# Patient Record
Sex: Male | Born: 1990 | Race: White | Hispanic: No | Marital: Single | Smoking: Never smoker
Health system: Southern US, Community
[De-identification: ages and names within clinical notes are randomized; demographics above are authoritative.]

## PROBLEM LIST (undated history)

## (undated) ENCOUNTER — Ambulatory Visit (HOSPITAL_COMMUNITY): Admission: EM | Discharge status: Absent without leave | Payer: Self-pay

## (undated) DIAGNOSIS — T7840XA Allergy, unspecified, initial encounter: Secondary | ICD-10-CM

## (undated) DIAGNOSIS — J45909 Unspecified asthma, uncomplicated: Secondary | ICD-10-CM

## (undated) HISTORY — DX: Unspecified asthma, uncomplicated: J45.909

## (undated) HISTORY — DX: Allergy, unspecified, initial encounter: T78.40XA

---

## 2011-04-09 ENCOUNTER — Ambulatory Visit: Payer: BC Managed Care – PPO

## 2011-04-09 ENCOUNTER — Ambulatory Visit (INDEPENDENT_AMBULATORY_CARE_PROVIDER_SITE_OTHER): Payer: BC Managed Care – PPO | Admitting: Family Medicine

## 2011-04-09 DIAGNOSIS — M549 Dorsalgia, unspecified: Secondary | ICD-10-CM

## 2011-04-09 DIAGNOSIS — J45990 Exercise induced bronchospasm: Secondary | ICD-10-CM | POA: Insufficient documentation

## 2011-04-09 MED ORDER — CYCLOBENZAPRINE HCL 10 MG PO TABS: 10.0000 mg | ORAL_TABLET | Freq: Two times a day (BID) | ORAL | Status: AC | PRN

## 2011-04-09 MED ORDER — ALBUTEROL SULFATE HFA 108 (90 BASE) MCG/ACT IN AERS: 2.0000 | INHALATION_SPRAY | Freq: Four times a day (QID) | RESPIRATORY_TRACT | Status: DC | PRN

## 2011-04-09 NOTE — Progress Notes (Signed)
  Patient Name: Glenn May Date of Birth: 1990/10/25 Medical Record Number: [id number] Gender: male Date of Encounter: 04/09/2011  History of Present Illness:  Glenn May is a 21 y.o. very pleasant male patient who presents with the following:  Today is Tuesday.  On Saturday he was playing basketball- his friend hit him with an elbow in his back/ "spine"  In the lower thoracic area when he was guarding him.  Did not hurt too much at the time.  Yesterday went to his job at [employer]- had to lift boxes for several hours- his back hurt when he got home and especially when he laid down.  Did not get much sleep last night.  Back is still sore- no history of prior back problems  Also needs a refill of his albuterol for occasional EIA symptoms.   Patient Active Problem List  Diagnoses  . Exercise-induced asthma   History reviewed. No pertinent past medical history. History reviewed. No pertinent past surgical history. History  Substance Use Topics  . Smoking status: Never Smoker   . Smokeless tobacco: Never Used  . Alcohol Use: No   History reviewed. No pertinent family history. No Known Allergies  Medication list has been reviewed and updated.  Review of Systems: As per HPI.  No numbness or tingling, no incontinence.   Physical Examination: Filed Vitals:   04/09/11 1829  BP: 116/65  Pulse: 57  Temp: 97.9 F (36.6 C)  TempSrc: Oral  Resp: 16  Height: 6' 0.5" (1.842 m)  Weight: 206 lb 12.8 oz (93.804 kg)    Body mass index is 27.66 kg/(m^2).  GEN: WDWN, NAD, Non-toxic, A & O x 3 HEENT: Atraumatic, Normocephalic. Neck supple. No masses, No LAD. Ears and Nose: No external deformity. CV: RRR, No M/G/R. No JVD. No thrill. No extra heart sounds. PULM: CTA B, no wheezes, crackles, rhonchi. No retractions. No resp. distress. No accessory muscle use. Back:  Tender over lower thoracic spine.  Small bruise.  Tight Paraspinous muscles as well. Bilateral legs with normal  strength, sensation and ROM, as well as normal patellar reflexes.   EXTR: No c/c/e NEURO Normal gait.  PSYCH: Normally interactive. Conversant. Not depressed or anxious appearing.  Calm demeanor.  [facility] reading (PRIMARY) by  Dr. Patsy Lager.  Negative 2v thoracic and lumbar spine films  Assessment and Plan: 1. Exercise-induced asthma  albuterol (PROVENTIL HFA;VENTOLIN HFA) 108 (90 BASE) MCG/ACT inhaler  2. Back pain  cyclobenzaprine (FLEXERIL) 10 MG tablet, DG Lumbar Spine 2-3 Views, DG Thoracic Spine 2 View    Prn albuterol as above. Back strain/ contusion.  Xrays reassuring.  Will plan to be out of work the rest of the week, let us know if not better within 4 or 5 days- Sooner if worse.

## 2012-01-21 ENCOUNTER — Ambulatory Visit (INDEPENDENT_AMBULATORY_CARE_PROVIDER_SITE_OTHER): Payer: BC Managed Care – PPO | Admitting: Family Medicine

## 2012-01-21 VITALS — BP 124/72 | HR 86 | Temp 98.2°F | Resp 18 | Ht 72.0 in | Wt 189.0 lb

## 2012-01-21 DIAGNOSIS — J329 Chronic sinusitis, unspecified: Secondary | ICD-10-CM

## 2012-01-21 DIAGNOSIS — J40 Bronchitis, not specified as acute or chronic: Secondary | ICD-10-CM

## 2012-01-21 DIAGNOSIS — J209 Acute bronchitis, unspecified: Secondary | ICD-10-CM

## 2012-01-21 MED ORDER — AZITHROMYCIN 250 MG PO TABS: ORAL_TABLET | ORAL | Status: DC

## 2012-01-21 NOTE — Progress Notes (Signed)
21 yo with 5-6 days of cough, weakness after birthday trip to [place].  Initially  Had low grade fever.  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  Objective:  NAD @[facility] @  Chief Complaint:  Chief Complaint  Patient presents with  . Cough    x 3 days  . Fatigue    HPI: 21 y.o. year old male presents with a 6 day history of nasal congestion, post nasal drip, sore throat, and cough. Mild sinus pressure. Afebrile. No chills. Nasal congestion thick and green/yellow. Cough is productive of green/yellow sputum and not associated with time of day. Ears feel full, leading to sensation of muffled hearing. Has tried OTC cold preps without success. No GI complaints.  No sick contacts, recent antibiotics, or recent travels.   No leg trauma, sedentary periods, h/o cancer, or tobacco use.  Past Medical History  Diagnosis Date  . Allergy   . Asthma      Home Meds: Prior to Admission medications   Medication Sig Start Date End Date Taking? Authorizing Provider  albuterol (PROVENTIL HFA;VENTOLIN HFA) 108 (90 BASE) MCG/ACT inhaler Inhale 2 puffs into the lungs every 6 (six) hours as needed for wheezing. 04/09/11 04/08/12  Pearline Cables, MD    Allergies: No Known Allergies  History   Social History  . Marital Status: Single    Spouse Name: N/A    Number of Children: N/A  . Years of Education: N/A   Occupational History  . Not on file.   Social History Main Topics  . Smoking status: Never Smoker   . Smokeless tobacco: Never Used  . Alcohol Use: Yes  . Drug Use: No  . Sexually Active: Yes    Birth Control/ Protection: Condom   Other Topics Concern  . Not on file   Social History Narrative  . No narrative on file     Review of Systems: Constitutional: negative for chills, fever, night sweats or weight changes Cardiovascular: negative for chest pain or palpitations Respiratory: negative for hemoptysis, wheezing, or shortness of breath Abdominal:  negative for abdominal pain, nausea, vomiting or diarrhea Dermatological: negative for rash Neurologic: negative for headache   Physical Exam: Blood pressure 124/72, pulse 86, temperature 98.2 F (36.8 C), temperature source Oral, resp. rate 18, height 6' (1.829 m), weight 189 lb (85.73 kg), SpO2 100.00%., Body mass index is 25.63 kg/(m^2). General: Well developed, well nourished, in no acute distress. Head: Normocephalic, atraumatic, eyes without discharge, sclera non-icteric, nares are congested. Bilateral auditory canals clear, TM's are without perforation, pearly grey with reflective cone of light bilaterally. No sinus TTP. Oral cavity moist, dentition normal. Posterior pharynx with post nasal drip and mild erythema. No peritonsillar abscess or tonsillar exudate. Neck: Supple. No thyromegaly. Full ROM. No lymphadenopathy. Lungs: Coarse breath sounds bilaterally with  rhonchi. Breathing is unlabored.  Heart: RRR with S1 S2. No murmurs, rubs, or gallops appreciated. Msk:  Strength and tone normal for age. Extremities: No clubbing or cyanosis. No edema. Neuro: Alert and oriented X 3. Moves all extremities spontaneously. CNII-XII grossly in tact. Psych:  Responds to questions appropriately with a normal affect.   Labs:   ASSESSMENT AND PLAN:  21 y.o. year old male with bronchitis and sinusitis - -Mucinex -Tylenol/Motrin prn -Rest/fluids -RTC precautions -RTC 3-5 days if no improvement  Signed, Elvina Sidle, MD 01/21/2012 3:45 PM

## 2012-01-21 NOTE — Patient Instructions (Addendum)

## 2012-05-25 ENCOUNTER — Ambulatory Visit (INDEPENDENT_AMBULATORY_CARE_PROVIDER_SITE_OTHER): Payer: BC Managed Care – PPO | Admitting: Physician Assistant

## 2012-05-25 VITALS — BP 118/68 | HR 58 | Temp 98.2°F | Resp 16 | Ht 72.0 in | Wt 193.0 lb

## 2012-05-25 DIAGNOSIS — M549 Dorsalgia, unspecified: Secondary | ICD-10-CM

## 2012-05-25 DIAGNOSIS — M62838 Other muscle spasm: Secondary | ICD-10-CM

## 2012-05-25 MED ORDER — METAXALONE 800 MG PO TABS: 800.0000 mg | ORAL_TABLET | Freq: Three times a day (TID) | ORAL | Status: DC

## 2012-05-25 MED ORDER — HYDROCODONE-ACETAMINOPHEN 5-325 MG PO TABS: 1.0000 | ORAL_TABLET | Freq: Four times a day (QID) | ORAL | Status: DC | PRN

## 2012-05-25 NOTE — Progress Notes (Signed)
Patient ID: DUNBAR BURAS MRN: [id number], DOB: 1990/09/04, 21 y.o. Date of Encounter: 05/25/2012, 6:47 PM  Primary Physician: No primary provider on file.  Chief Complaint: Back pain x 1 day  HPI: 22 y.o. male with history below presents with back pain for one day. Patient was playing basketball at the time of the injury. Patient states he was going up for a rebound and trying to tip up the ball. Upon doing so he felt like he may have hyperextended his lower back. He continued to play in his basketball game. His back was a little sore at that time, but continued to play. Later that evening he noticed that his left lower back began to tighten up prompting him to come in for evaluation this evening. He knew he would be unable to perform his usual job tasks at [employer] this evening of lifting heavy boxes. He denies any midline tenderness, pain radiating down into his leg, weakness into his leg, or loss of bowel or bladder function. Generally healthy.    Past Medical History  Diagnosis Date  . Allergy   . Asthma      Home Meds: Prior to Admission medications   Medication Sig Start Date End Date Taking? Authorizing Provider                  Allergies: No Known Allergies  History   Social History  . Marital Status: Single    Spouse Name: N/A    Number of Children: N/A  . Years of Education: N/A   Occupational History  . Not on file.   Social History Main Topics  . Smoking status: Never Smoker   . Smokeless tobacco: Never Used  . Alcohol Use: Yes  . Drug Use: No  . Sexually Active: Yes    Birth Control/ Protection: Condom   Other Topics Concern  . Not on file   Social History Narrative  . No narrative on file     Review of Systems: Constitutional: negative for chills, fever, night sweats, weight changes, or fatigue  Cardiovascular: negative for chest pain or palpitations Respiratory: negative for hemoptysis, wheezing, shortness of breath, or cough Dermatological:  negative for rash Neurologic: negative for headache, dizziness, or syncope   Physical Exam: Blood pressure 118/68, pulse 58, temperature 98.2 F (36.8 C), resp. rate 16, height 6' (1.829 m), weight 193 lb (87.544 kg)., Body mass index is 26.17 kg/(m^2). General: Well developed, well nourished, in no acute distress. Head: Normocephalic, atraumatic, eyes without discharge, sclera non-icteric, nares are without discharge.   Neck: Supple. Full ROM.  Lungs: Breathing is unlabored. Heart: Regular rate. Msk:  Strength and tone normal for age. Back: No midline or bony TTP. FROM. 5/5 strength. TTP of left lumbar paraspinal muscles. No CVA TTP. Negative SLR seated and supine bilateral.  Extremities/Skin: Warm and dry. No clubbing or cyanosis. No edema. No rashes or suspicious lesions. Neuro: Alert and oriented X 3. Moves all extremities spontaneously. Gait is normal. CNII-XII grossly in tact. DTR 2+ bilaterally in lower extremities. Equal sensation bilateral lower extremities.  Psych:  Responds to questions appropriately with a normal affect.     ASSESSMENT AND PLAN:  22 y.o. male with muscle strain/spasm and back pain -Skelaxin 800 mg 1 po tid #40 no RF -Norco 5/325 mg 1 po q 4-6 hours prn pain #30 no RF, SED -Heat -Rest -Out of work today and tomorrow -No worrisome symptoms or findings or exam   Signed, Eula Listen, PA-C 05/25/2012  6:47 PM

## 2012-12-02 ENCOUNTER — Ambulatory Visit (INDEPENDENT_AMBULATORY_CARE_PROVIDER_SITE_OTHER): Payer: BC Managed Care – PPO | Admitting: Physician Assistant

## 2012-12-02 VITALS — BP 116/68 | HR 72 | Temp 97.6°F | Resp 14 | Ht 71.0 in | Wt 193.0 lb

## 2012-12-02 DIAGNOSIS — J029 Acute pharyngitis, unspecified: Secondary | ICD-10-CM

## 2012-12-02 DIAGNOSIS — J309 Allergic rhinitis, unspecified: Secondary | ICD-10-CM

## 2012-12-02 MED ORDER — IPRATROPIUM BROMIDE 0.03 % NA SOLN: 2.0000 | Freq: Two times a day (BID) | NASAL | Status: DC

## 2012-12-02 MED ORDER — FIRST-DUKES MOUTHWASH MT SUSP: 5.0000 mL | OROMUCOSAL | Status: DC | PRN

## 2012-12-02 NOTE — Progress Notes (Signed)
  Subjective:    Patient ID: Glenn May, male    DOB: 04-09-1990, 22 y.o.   MRN: [id number]  HPI 22 year old male presents with 4 day history of scratchy, irritated throat, slight nasal congestion, and sinus pressure. Symptoms have been present for 3 days but do seem to be improving. Does admit to some fatigue and subjective chills but no documented fever. Denies nausea, vomiting, headache, otalgia, cough, SOB, or wheezing. Does have a hx of seasonal allergies treated with OTC Claritin as needed.  Patient is otherwise healthy with no other concerns today.     Review of Systems  Constitutional: Negative for fever and chills.  HENT: Positive for congestion, postnasal drip, rhinorrhea, sinus pressure and sore throat.   Respiratory: Negative for cough, chest tightness, shortness of breath and wheezing.   Gastrointestinal: Negative for nausea, vomiting and abdominal pain.  Neurological: Negative for dizziness and headaches.       Objective:   Physical Exam  Constitutional: He is oriented to person, place, and time. He appears well-developed and well-nourished.  HENT:  Head: Normocephalic and atraumatic.  Right Ear: Hearing, tympanic membrane, external ear and ear canal normal.  Left Ear: Hearing, tympanic membrane, external ear and ear canal normal.  Mouth/Throat: Uvula is midline, oropharynx is clear and moist and mucous membranes are normal. No oropharyngeal exudate.  Eyes: Conjunctivae are normal.  Neck: Normal range of motion. Neck supple.  Cardiovascular: Normal rate, regular rhythm and normal heart sounds.   Pulmonary/Chest: Effort normal and breath sounds normal.  Lymphadenopathy:    He has no cervical adenopathy.  Neurological: He is alert and oriented to person, place, and time.  Psychiatric: He has a normal mood and affect. His behavior is normal. Judgment and thought content normal.          Assessment & Plan:  Allergic rhinitis - Plan: ipratropium (ATROVENT) 0.03 %  nasal spray  Acute pharyngitis - Plan: Diphenhyd-Hydrocort-Nystatin (FIRST-DUKES MOUTHWASH) SUSP  Trial of Atrovent NS to help with PND.  Start Claritin daily in the morning Duke's mouthwash q2-3hours prn pain. Ok to continue throat lozenges RTC precautions discussed including development of fever, productive cough, or difficulty swallowing.

## 2013-05-04 IMAGING — CR DG THORACIC SPINE 2V
2 series · 2 of 2 positions shown · non-contrast
Comparison: Lumbar series from the same day reported separately.

CLINICAL DATA: 20-year-old male with back pain.

THORACIC SPINE - 2 VIEW

[AP]
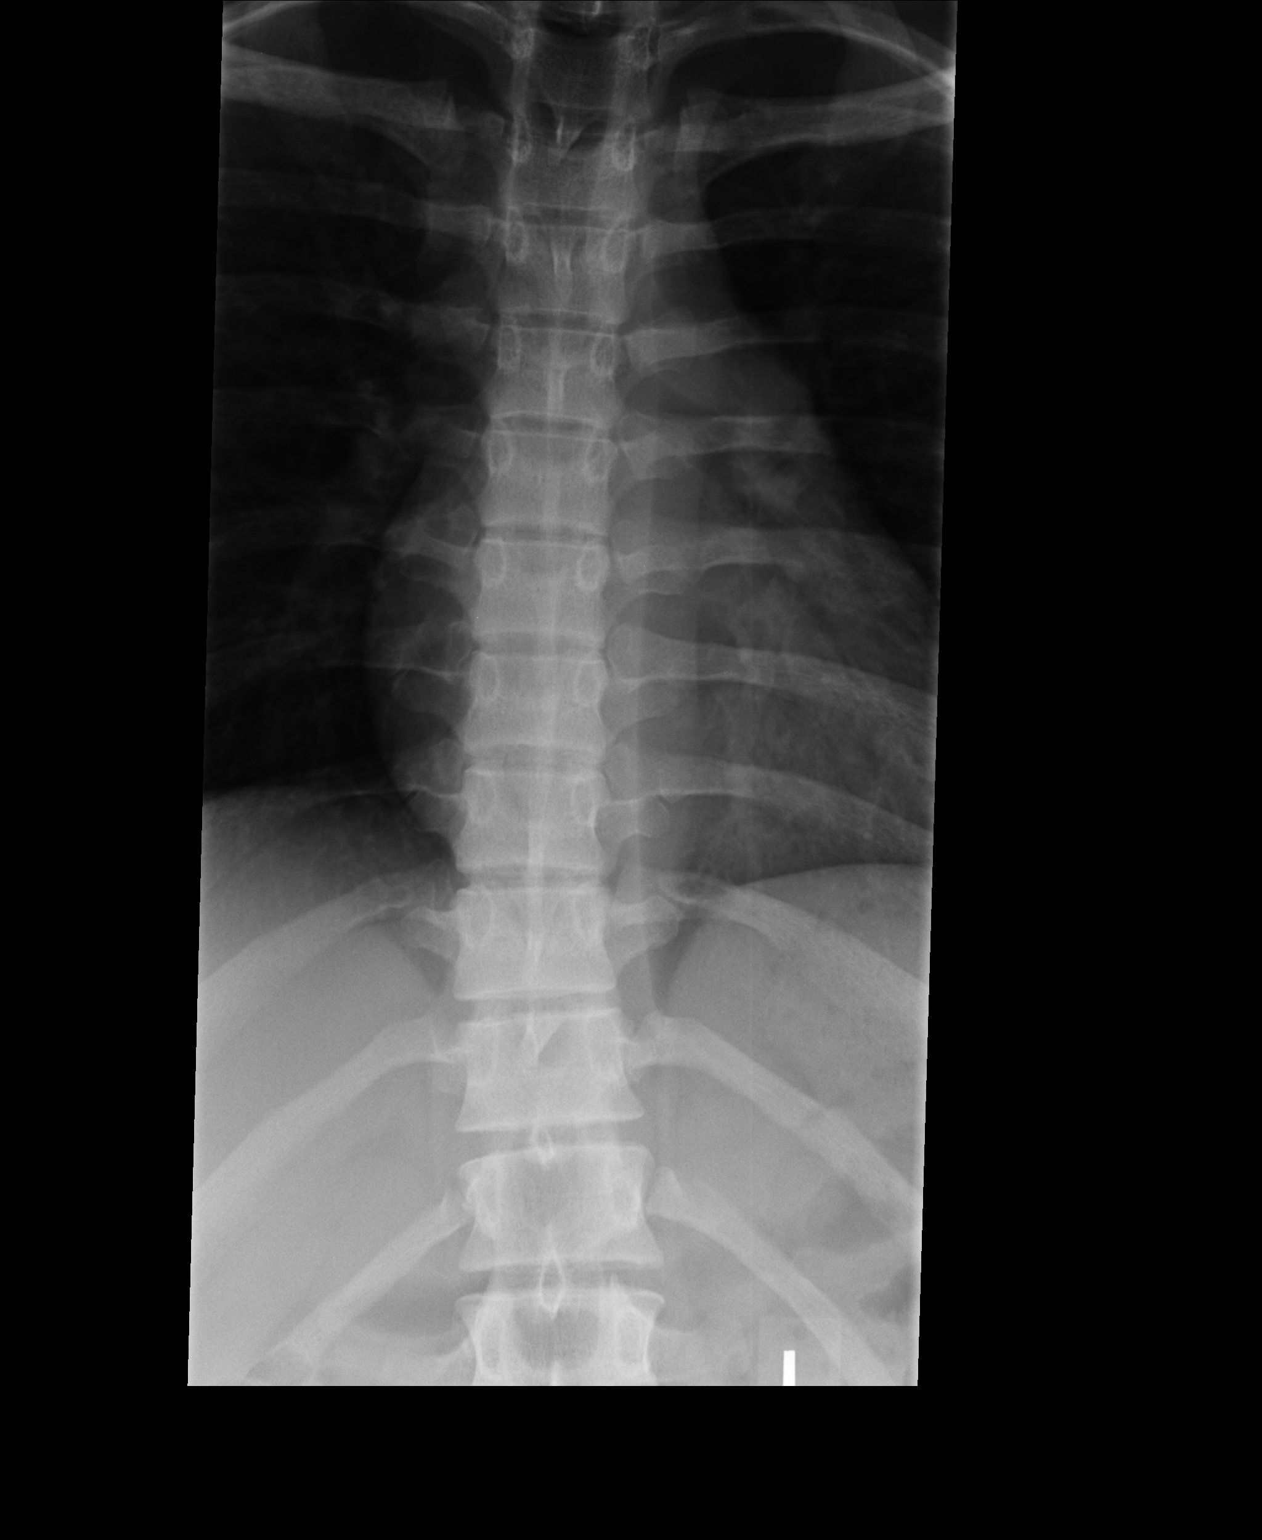

[lateral]
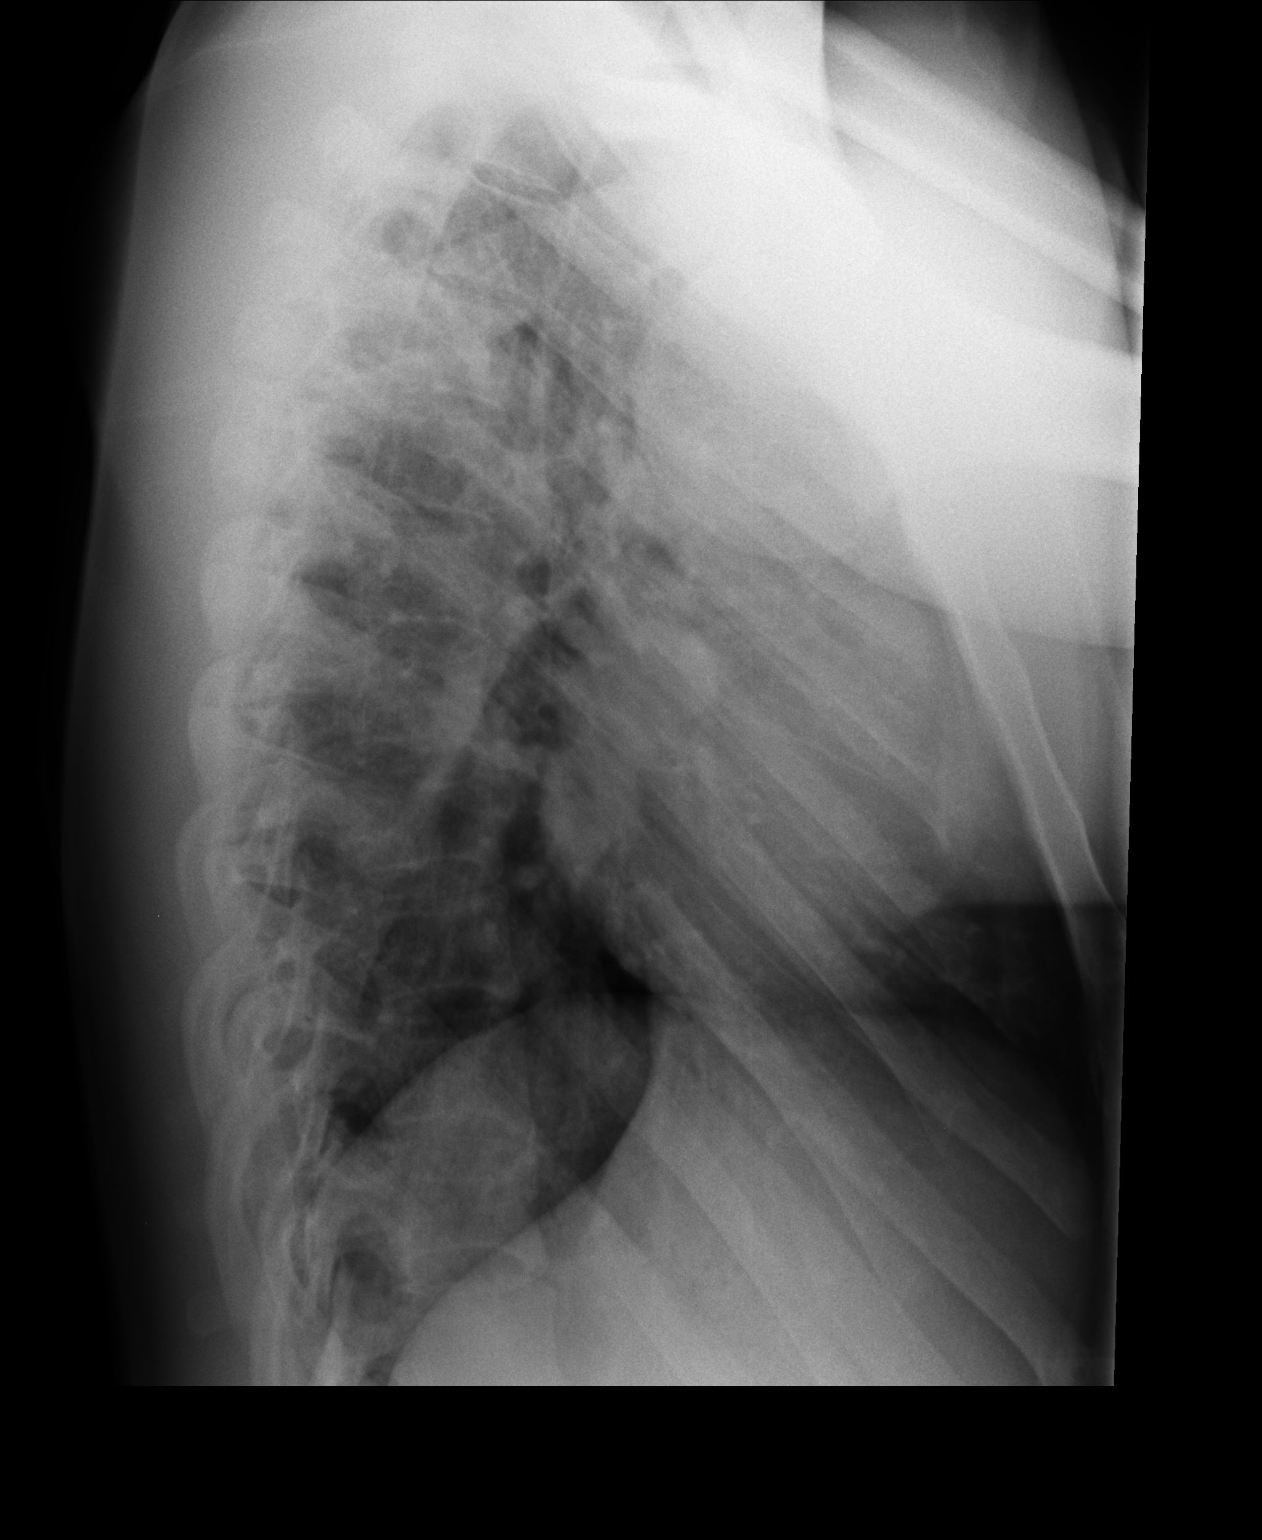

[2 of 2 positions shown; findings below may reference images not displayed]

FINDINGS: The T1 vertebral body is not included in its entirety on
the AP view.  Normal thoracic segmentation. Bone mineralization is
within normal limits.  Normal height and alignment aside from mild
dextroconvex scoliosis.  Preserved disc spaces appear grossly
normal visualized thoracic visceral contours.
IMPRESSION: No acute osseous abnormality in the thoracic spine.

Discrepancy from primary reading by Dr. Battissa:  None significant.

## 2013-07-29 ENCOUNTER — Ambulatory Visit (INDEPENDENT_AMBULATORY_CARE_PROVIDER_SITE_OTHER): Payer: BC Managed Care – PPO | Admitting: Emergency Medicine

## 2013-07-29 VITALS — BP 109/79 | HR 63 | Temp 97.8°F | Resp 18 | Wt 205.0 lb

## 2013-07-29 DIAGNOSIS — S335XXA Sprain of ligaments of lumbar spine, initial encounter: Secondary | ICD-10-CM

## 2013-07-29 MED ORDER — NAPROXEN SODIUM 550 MG PO TABS: 550.0000 mg | ORAL_TABLET | Freq: Two times a day (BID) | ORAL | Status: AC

## 2013-07-29 MED ORDER — CYCLOBENZAPRINE HCL 10 MG PO TABS: 10.0000 mg | ORAL_TABLET | Freq: Three times a day (TID) | ORAL | Status: DC | PRN

## 2013-07-29 NOTE — Progress Notes (Signed)
[facility] [address], [place] [address] [phone]  Date:  07/29/2013   Name:  Glenn May   DOB:  Mar 28, 1990   MRN:  [id number]  PCP:  No PCP Per Patient    Chief Complaint: Back Pain   History of Present Illness:  Glenn May is a 23 y.o. very pleasant male patient who presents with the following:  Works for [employer] and developed low back on the right last week.  Non radiating. No neuro symptoms.  No discrete injury or overuse.  Pain persists this week.  No pain with bending.  Worse when twists.  No increase in pain when sneezes or coughs.  No GU symptoms.  No improvement with over the counter medications or other home remedies. Denies other complaint or health concern today.   Patient Active Problem List   Diagnosis Date Noted  . Exercise-induced asthma 04/09/2011    Past Medical History  Diagnosis Date  . Allergy   . Asthma     No past surgical history on file.  History  Substance Use Topics  . Smoking status: Never Smoker   . Smokeless tobacco: Never Used  . Alcohol Use: Yes    No family history on file.  No Known Allergies  Medication list has been reviewed and updated.  Current Outpatient Prescriptions on File Prior to Visit  Medication Sig Dispense Refill  . ipratropium (ATROVENT) 0.03 % nasal spray Place 2 sprays into the nose 2 (two) times daily.  30 mL  0   No current facility-administered medications on file prior to visit.    Review of Systems:  As per HPI, otherwise negative.    Physical Examination: Filed Vitals:   07/29/13 1600  BP: 109/79  Pulse: 63  Temp: 97.8 F (36.6 C)  Resp: 18   Filed Vitals:   07/29/13 1600  Weight: 205 lb (92.987 kg)   Body mass index is 28.6 kg/(m^2). Ideal Body Weight:     GEN: WDWN, NAD, Non-toxic, Alert & Oriented x 3 HEENT: Atraumatic, Normocephalic.  Ears and Nose: No external deformity. EXTR: No clubbing/cyanosis/edema NEURO: Normal gait.  PSYCH: Normally  interactive. Conversant. Not depressed or anxious appearing.  Calm demeanor.  BACK:  Tender right lumbar paraspinous muscles.  Neuro intact  Assessment and Plan: Lumbosacral strain Anaprox flexeril  Signed,  Phillips Odor, MD

## 2013-07-29 NOTE — Patient Instructions (Signed)
Lumbosacral Strain Lumbosacral strain is a strain of any of the parts that make up your lumbosacral vertebrae. Your lumbosacral vertebrae are the bones that make up the lower third of your backbone. Your lumbosacral vertebrae are held together by muscles and tough, fibrous tissue (ligaments).  CAUSES  A sudden blow to your back can cause lumbosacral strain. Also, anything that causes an excessive stretch of the muscles in the low back can cause this strain. This is typically seen when people exert themselves strenuously, fall, lift heavy objects, bend, or crouch repeatedly. RISK FACTORS  Physically demanding work.  Participation in pushing or pulling sports or sports that require sudden twist of the back (tennis, golf, baseball).  Weight lifting.  Excessive lower back curvature.  Forward-tilted pelvis.  Weak back or abdominal muscles or both.  Tight hamstrings. SIGNS AND SYMPTOMS  Lumbosacral strain may cause pain in the area of your injury or pain that moves (radiates) down your leg.  DIAGNOSIS Your health care provider can often diagnose lumbosacral strain through a physical exam. In some cases, you may need tests such as X-ray exams.  TREATMENT  Treatment for your lower back injury depends on many factors that your clinician will have to evaluate. However, most treatment will include the use of anti-inflammatory medicines. HOME CARE INSTRUCTIONS   Avoid hard physical activities (tennis, racquetball, waterskiing) if you are not in proper physical condition for it. This may aggravate or create problems.  If you have a back problem, avoid sports requiring sudden body movements. Swimming and walking are generally safer activities.  Maintain good posture.  Maintain a healthy weight.  For acute conditions, you may put ice on the injured area.  Put ice in a plastic bag.  Place a towel between your skin and the bag.  Leave the ice on for 20 minutes, 2 3 times a day.  When the  low back starts healing, stretching and strengthening exercises may be recommended. SEEK MEDICAL CARE IF:  Your back pain is getting worse.  You experience severe back pain not relieved with medicines. SEEK IMMEDIATE MEDICAL CARE IF:   You have numbness, tingling, weakness, or problems with the use of your arms or legs.  There is a change in bowel or bladder control.  You have increasing pain in any area of the body, including your belly (abdomen).  You notice shortness of breath, dizziness, or feel faint.  You feel sick to your stomach (nauseous), are throwing up (vomiting), or become sweaty.  You notice discoloration of your toes or legs, or your feet get very cold. MAKE SURE YOU:   Understand these instructions.  Will watch your condition.  Will get help right away if you are not doing well or get worse. Document Released: 11/14/2004 Document Revised: 11/25/2012 Document Reviewed: 09/23/2012 ExitCare Patient Information 2014 [employer].  

## 2014-11-22 ENCOUNTER — Ambulatory Visit (INDEPENDENT_AMBULATORY_CARE_PROVIDER_SITE_OTHER): Payer: BLUE CROSS/BLUE SHIELD | Admitting: Family Medicine

## 2014-11-22 VITALS — BP 122/78 | HR 74 | Temp 98.3°F | Resp 16 | Ht 73.0 in | Wt 201.4 lb

## 2014-11-22 DIAGNOSIS — Z13220 Encounter for screening for lipoid disorders: Secondary | ICD-10-CM

## 2014-11-22 DIAGNOSIS — J4520 Mild intermittent asthma, uncomplicated: Secondary | ICD-10-CM

## 2014-11-22 DIAGNOSIS — Z113 Encounter for screening for infections with a predominantly sexual mode of transmission: Secondary | ICD-10-CM | POA: Diagnosis not present

## 2014-11-22 DIAGNOSIS — Z23 Encounter for immunization: Secondary | ICD-10-CM | POA: Diagnosis not present

## 2014-11-22 DIAGNOSIS — Z0000 Encounter for general adult medical examination without abnormal findings: Secondary | ICD-10-CM

## 2014-11-22 DIAGNOSIS — Z130 Encounter for screening for diseases of the blood and blood-forming organs and certain disorders involving the immune mechanism: Secondary | ICD-10-CM | POA: Diagnosis not present

## 2014-11-22 LAB — CBC
HCT: 45.9 % (ref 39.0–52.0)
Hemoglobin: 15.1 g/dL (ref 13.0–17.0)
MCH: 28.7 pg (ref 26.0–34.0)
MCHC: 32.9 g/dL (ref 30.0–36.0)
MCV: 87.3 fL (ref 78.0–100.0)
MPV: 10.3 fL (ref 8.6–12.4)
Platelets: 217 10*3/uL (ref 150–400)
RBC: 5.26 MIL/uL (ref 4.22–5.81)
RDW: 12.7 % (ref 11.5–15.5)
WBC: 4.9 10*3/uL (ref 4.0–10.5)

## 2014-11-22 LAB — COMPLETE METABOLIC PANEL WITH GFR
CO2: 27 mmol/L (ref 20–31)
Calcium: 9.7 mg/dL (ref 8.6–10.3)
Chloride: 101 mmol/L (ref 98–110)
Creat: 1.14 mg/dL (ref 0.60–1.35)
GFR, Est Non African American: >89 mL/min (ref >=60)
Glucose, Bld: 80 mg/dL (ref 65–99)
Potassium: 3.8 mmol/L (ref 3.5–5.3)
Sodium: 135 mmol/L (ref 135–146)
Total Bilirubin: 1.1 mg/dL (ref 0.2–1.2)
Total Protein: 7.2 g/dL (ref 6.1–8.1)

## 2014-11-22 LAB — LIPID PANEL
Cholesterol: 158 mg/dL (ref 125–200)
HDL: 54 mg/dL (ref >=40)
LDL Cholesterol: 94 mg/dL (ref <130)
Total CHOL/HDL Ratio: 2.9 Ratio (ref <=5.0)
Triglycerides: 52 mg/dL (ref <150)
VLDL: 10 mg/dL (ref <30)

## 2014-11-22 LAB — COMPLETE METABOLIC PANEL WITHOUT GFR
ALT: 20 U/L (ref 9–46)
AST: 23 U/L (ref 10–40)
Albumin: 4.7 g/dL (ref 3.6–5.1)
Alkaline Phosphatase: 79 U/L (ref 40–115)
BUN: 13 mg/dL (ref 7–25)
GFR, Est African American: >89 mL/min (ref >=60)

## 2014-11-22 MED ORDER — ALBUTEROL SULFATE HFA 108 (90 BASE) MCG/ACT IN AERS: 2.0000 | INHALATION_SPRAY | Freq: Four times a day (QID) | RESPIRATORY_TRACT | Status: AC | PRN

## 2014-11-22 NOTE — Progress Notes (Signed)
      Chief Complaint:  Chief Complaint  Patient presents with  . Annual Exam  . Flu Vaccine    HPI: Glenn May is a 24 y.o. male who reports to [facility] today complaining of  Annual PE, no complaints He works out 4 times a [employer], he bench presses, treadmill and he works at [employer] active, wears condoms regular, no STD in the past He ahs chest tightness when he exerts, he would like an inhaler He has allergies and take claritin  He does not remember last asthma attacks UTD on tetanus vaccines   Past Medical History  Diagnosis Date  . Allergy   . Asthma    History reviewed. No pertinent past surgical history. Social History   Social History  . Marital Status: Single    Spouse Name: N/A  . Number of Children: N/A  . Years of Education: N/A   Social History Main Topics  . Smoking status: Never Smoker   . Smokeless tobacco: Never Used  . Alcohol Use: Yes  . Drug Use: No  . Sexual Activity: Yes    Birth Control/ Protection: Condom   Other Topics Concern  . None   Social History Narrative   History reviewed. No pertinent family history. No Known Allergies Prior to Admission medications   Medication Sig Start Date End Date Taking? Authorizing Provider  loratadine (CLARITIN) 10 MG tablet Take 10 mg by mouth daily.   Yes Historical Provider, MD  Multiple Vitamin (MULTIVITAMIN) tablet Take 1 tablet by mouth daily.   Yes Historical Provider, MD     ROS: The patient denies fevers, chills, night sweats, unintentional weight loss, chest pain, palpitations, wheezing, dyspnea on exertion, nausea, vomiting, abdominal pain, dysuria, hematuria, melena, numbness, weakness, or tingling.  All other systems have been reviewed and were otherwise negative with the exception of those mentioned in the HPI and as above.    PHYSICAL EXAM: Filed Vitals:   11/22/14 1212  BP: 122/78  Pulse: 74  Temp: 98.3 F (36.8 C)  Resp: 16   Body mass index is 26.58  kg/(m^2).   General: Alert, no acute distress HEENT:  Normocephalic, atraumatic, oropharynx patent. EOMI, PERRLA Cardiovascular:  Regular rate and rhythm, no rubs murmurs or gallops.  No Carotid bruits, radial pulse intact. No pedal edema.  Respiratory: Clear to auscultation bilaterally.  No wheezes, rales, or rhonchi.  No cyanosis, no use of accessory musculature Abdominal: No organomegaly, abdomen is soft and non-tender, positive bowel sounds. No masses. Skin: No rashes. Neurologic: Facial musculature symmetric. Psychiatric: Patient acts appropriately throughout our interaction. Lymphatic: No cervical or submandibular lymphadenopathy Musculoskeletal: Gait intact. No edema, tenderness UTD on tetanus    LABS: No results found for this or any previous visit.   EKG/XRAY:   Primary read interpreted by Dr. Conley Rolls at [facility].   ASSESSMENT/PLAN: Encounter Diagnoses  Name Primary?  . Annual physical exam Yes  . Flu vaccine need   . Screening for hyperlipidemia   . Screening for deficiency anemia   . Screening for STD (sexually transmitted disease)   . Asthma, mild intermittent, uncomplicated    UTD on tetanus Flu vaccine given today Rx albuterol inhaler for asthma sxs Labs pending Fu prn   Gross sideeffects, risk and benefits, and alternatives of medications d/w patient. Patient is aware that all medications have potential sideeffects and we are unable to predict every sideeffect or drug-drug interaction that may occur.  Polly Barner DO  11/22/2014 12:47 PM

## 2014-11-23 LAB — HSV(HERPES SIMPLEX VRS) I + II AB-IGG
HSV 1 Glycoprotein G Ab, IgG: 8.09 IV — ABNORMAL HIGH
HSV 2 Glycoprotein G Ab, IgG: 3.93 IV — ABNORMAL HIGH

## 2014-11-23 LAB — RPR

## 2014-11-23 LAB — HIV ANTIBODY (ROUTINE TESTING W REFLEX): HIV 1&2 Ab, 4th Generation: NONREACTIVE

## 2014-11-23 LAB — GC/CHLAMYDIA PROBE AMP
CT Probe RNA: NEGATIVE
GC Probe RNA: NEGATIVE

## 2015-05-05 ENCOUNTER — Encounter: Payer: Self-pay | Admitting: Family Medicine

## 2015-05-08 ENCOUNTER — Telehealth: Payer: Self-pay | Admitting: Family Medicine

## 2015-05-08 NOTE — Telephone Encounter (Signed)
Late entry: spoke to patient about labs, new address [address] , [address], [place] [id number]

## 2017-11-15 ENCOUNTER — Ambulatory Visit: Payer: BLUE CROSS/BLUE SHIELD

## 2017-12-20 ENCOUNTER — Other Ambulatory Visit: Payer: Self-pay

## 2017-12-20 ENCOUNTER — Encounter (HOSPITAL_COMMUNITY): Payer: Self-pay | Admitting: Emergency Medicine

## 2017-12-20 ENCOUNTER — Emergency Department (HOSPITAL_COMMUNITY)
Admission: EM | Admit: 2017-12-20 | Discharge: 2017-12-20 | Disposition: A | Discharge status: Home / Self Care | Payer: BLUE CROSS/BLUE SHIELD | Attending: Emergency Medicine | Admitting: Emergency Medicine

## 2017-12-20 DIAGNOSIS — Z23 Encounter for immunization: Secondary | ICD-10-CM | POA: Insufficient documentation

## 2017-12-20 DIAGNOSIS — Z79899 Other long term (current) drug therapy: Secondary | ICD-10-CM | POA: Insufficient documentation

## 2017-12-20 DIAGNOSIS — S61210A Laceration without foreign body of right index finger without damage to nail, initial encounter: Secondary | ICD-10-CM

## 2017-12-20 DIAGNOSIS — S61214A Laceration without foreign body of right ring finger without damage to nail, initial encounter: Secondary | ICD-10-CM | POA: Diagnosis present

## 2017-12-20 DIAGNOSIS — J45909 Unspecified asthma, uncomplicated: Secondary | ICD-10-CM | POA: Diagnosis not present

## 2017-12-20 DIAGNOSIS — Y939 Activity, unspecified: Secondary | ICD-10-CM | POA: Diagnosis not present

## 2017-12-20 DIAGNOSIS — W25XXXA Contact with sharp glass, initial encounter: Secondary | ICD-10-CM | POA: Insufficient documentation

## 2017-12-20 DIAGNOSIS — Y929 Unspecified place or not applicable: Secondary | ICD-10-CM | POA: Diagnosis not present

## 2017-12-20 DIAGNOSIS — Y999 Unspecified external cause status: Secondary | ICD-10-CM | POA: Insufficient documentation

## 2017-12-20 MED ORDER — "THROMBI-PAD 3""X3"" EX PADS"
1.0000 | MEDICATED_PAD | Freq: Once | CUTANEOUS | Status: AC
  Administered 2017-12-20: 1 via TOPICAL
  Filled 2017-12-20: qty 2

## 2017-12-20 MED ORDER — TETANUS-DIPHTH-ACELL PERTUSSIS 5-2.5-18.5 LF-MCG/0.5 IM SUSP
0.5000 mL | Freq: Once | INTRAMUSCULAR | Status: AC
  Administered 2017-12-20: 0.5 mL via INTRAMUSCULAR
  Filled 2017-12-20: qty 0.5

## 2017-12-20 NOTE — ED Provider Notes (Signed)
[facility] [place] [facility] EMERGENCY DEPARTMENT Provider Note   CSN: [id number] Arrival date & time: 12/20/17  0031     History   Chief Complaint Chief Complaint  Patient presents with  . Extremity Laceration    HPI Glenn May is a 27 y.o. male.  Patient to ED with wound to right 3rd finger after cutting it on a broken glass bottle yesterday afternoon. Delayed presentation to ED because he felt he could treat it at home but the bleeding was not controlled. No other injury.  The history is provided by the patient. No language interpreter was used.    Past Medical History:  Diagnosis Date  . Allergy   . Asthma     Patient Active Problem List   Diagnosis Date Noted  . Exercise-induced asthma 04/09/2011    History reviewed. No pertinent surgical history.      Home Medications    Prior to Admission medications   Medication Sig Start Date End Date Taking? Authorizing Provider  albuterol (PROVENTIL HFA;VENTOLIN HFA) 108 (90 BASE) MCG/ACT inhaler Inhale 2 puffs into the lungs every 6 (six) hours as needed for wheezing or shortness of breath. 11/22/14   Le, Thao P, DO  loratadine (CLARITIN) 10 MG tablet Take 10 mg by mouth daily.    [provider]  Multiple Vitamin (MULTIVITAMIN) tablet Take 1 tablet by mouth daily.    [provider]    Family History No family history on file.  Social History Social History   Tobacco Use  . Smoking status: Never Smoker  . Smokeless tobacco: Never Used  Substance Use Topics  . Alcohol use: Yes  . Drug use: No     Allergies   Patient has no known allergies.   Review of Systems Review of Systems  Musculoskeletal:       See HPI.  Skin: Positive for wound.  Neurological: Negative for numbness.     Physical Exam Updated Vital Signs BP 134/78 (BP Location: Left Arm)   Pulse 61   Temp 98.7 F (37.1 C) (Oral)   Resp 14   Ht 6\' 1"  (1.854 m)   Wt 88.5 kg   SpO2 100%   BMI 25.73 kg/m    Physical Exam  Constitutional: He is oriented to person, place, and time. He appears well-developed and well-nourished.  Neck: Normal range of motion.  Pulmonary/Chest: Effort normal.  Musculoskeletal: Normal range of motion.  Neurological: He is alert and oriented to person, place, and time.  Skin: Skin is warm and dry.  Deep abrasion to distal lateral right 3rd finger. No active bleeding. Nail is intact.   Psychiatric: He has a normal mood and affect.     ED Treatments / Results  Labs (all labs ordered are listed, but only abnormal results are displayed) Labs Reviewed - No data to display  EKG None  Radiology No results found.  Procedures Procedures (including critical care time)  Medications Ordered in ED Medications - No data to display   Initial Impression / Assessment and Plan / ED Course  I have reviewed the triage vital signs and the nursing notes.  Pertinent labs & imaging results that were available during my care of the patient were reviewed by me and considered in my medical decision making (see chart for details).     Patient presents with bleeding avulsion laceration to right 3rd finger. No bleeding at present.   Wound cleaned and dressed with Thrombi-pad to avoid recurrent bleeding. PCP follow up recommended.  Final Clinical Impressions(s) / ED Diagnoses   Final diagnoses:  None   1. Finger laceration  ED Discharge Orders    None       Elpidio Anis, PA-C 12/20/17 1191    Gilda Crease, MD 12/20/17 [phone]

## 2017-12-20 NOTE — ED Triage Notes (Signed)
Pt c/o cutting layer of skin off R 3rd finger with a glass beer bottle around 4:30pm.  States he works at [employer] and it continued to bleed throughout the evening while working and lifting boxes.  Bleeding controlled at present.  Unknown DT.

## 2017-12-20 NOTE — Discharge Instructions (Addendum)
Leave bandage in place for the next 48 hours then remove. Use the remainder of the thrombi-pad to re-bandage if there is any bleeding.

## 2018-11-20 ENCOUNTER — Encounter (HOSPITAL_COMMUNITY): Payer: Self-pay | Admitting: Emergency Medicine

## 2018-11-20 ENCOUNTER — Other Ambulatory Visit: Payer: Self-pay

## 2018-11-20 ENCOUNTER — Ambulatory Visit (HOSPITAL_COMMUNITY)
Admission: EM | Admit: 2018-11-20 | Discharge: 2018-11-20 | Disposition: A | Discharge status: Home / Self Care | Payer: BC Managed Care – PPO | Attending: Physician Assistant | Admitting: Physician Assistant

## 2018-11-20 DIAGNOSIS — S0181XA Laceration without foreign body of other part of head, initial encounter: Secondary | ICD-10-CM | POA: Diagnosis not present

## 2018-11-20 NOTE — ED Provider Notes (Signed)
[facility]    CSN: [id number] Arrival date & time: 11/20/18  1228      History   Chief Complaint Chief Complaint  Patient presents with  . Laceration    HPI Glenn May is a 28 y.o. male.   Presents with a laceration to forehead. Occurred this am. He was lifting a barbell and it fell on his forehead. Did not have LOC. Currently feels well. See ROS     Past Medical History:  Diagnosis Date  . Allergy   . Asthma     Patient Active Problem List   Diagnosis Date Noted  . Exercise-induced asthma 04/09/2011    History reviewed. No pertinent surgical history.     Home Medications    Prior to Admission medications   Medication Sig Start Date End Date Taking? Authorizing Provider  albuterol (PROVENTIL HFA;VENTOLIN HFA) 108 (90 BASE) MCG/ACT inhaler Inhale 2 puffs into the lungs every 6 (six) hours as needed for wheezing or shortness of breath. 11/22/14   Le, Thao P, DO  loratadine (CLARITIN) 10 MG tablet Take 10 mg by mouth daily.    [provider]  Multiple Vitamin (MULTIVITAMIN) tablet Take 1 tablet by mouth daily.    [provider]    Family History History reviewed. No pertinent family history.  Social History Social History   Tobacco Use  . Smoking status: Never Smoker  . Smokeless tobacco: Never Used  Substance Use Topics  . Alcohol use: Yes  . Drug use: No     Allergies   Patient has no known allergies.   Review of Systems Review of Systems  Constitutional: Negative.   Eyes: Negative for visual disturbance.  Respiratory: Negative.   Skin: Negative.   Neurological: Positive for headaches. Negative for dizziness, facial asymmetry and weakness.  Hematological: Negative.   Psychiatric/Behavioral: Negative.      Physical Exam Triage Vital Signs ED Triage Vitals  Enc Vitals Group     BP 11/20/18 1250 131/65     Pulse Rate 11/20/18 1250 (!) 52     Resp 11/20/18 1250 18     Temp 11/20/18 1250 98.1 F (36.7  C)     Temp Source 11/20/18 1250 Oral     SpO2 11/20/18 1250 98 %     Weight --      Height --      Head Circumference --      Peak Flow --      Pain Score 11/20/18 1251 6     Pain Loc --      Pain Edu? --      Excl. in GC? --    No data found.  Updated Vital Signs BP 131/65 (BP Location: Right Arm)   Pulse (!) 52   Temp 98.1 F (36.7 C) (Oral)   Resp 18   SpO2 98%   Visual Acuity Right Eye Distance:   Left Eye Distance:   Bilateral Distance:    Right Eye Near:   Left Eye Near:    Bilateral Near:     Physical Exam Vitals signs and nursing note reviewed.  Constitutional:      General: He is not in acute distress. HENT:     Head: Normocephalic.     Comments: Small 2cm vertical laceration to mid forehead, bleeding stopped, no FB noted Eyes:     General: No scleral icterus.    Extraocular Movements: Extraocular movements intact.     Pupils: Pupils are equal, round, and reactive  to light.  Neck:     Musculoskeletal: Normal range of motion and neck supple.  Pulmonary:     Effort: Pulmonary effort is normal.  Skin:    General: Skin is warm and dry.     Comments: See Head exam  Neurological:     General: No focal deficit present.     Mental Status: He is alert and oriented to person, place, and time.     Motor: No weakness.     Gait: Gait normal.  Psychiatric:        Mood and Affect: Mood normal.        Judgment: Judgment normal.      UC Treatments / Results  Labs (all labs ordered are listed, but only abnormal results are displayed) Labs Reviewed - No data to display  EKG   Radiology No results found.  Procedures Laceration Repair  Date/Time: 11/20/2018 1:49 PM Performed by: Bjorn Pippin, PA-C Authorized by: Bjorn Pippin, PA-C   Consent:    Consent obtained:  Verbal   Consent given by:  Patient   Risks discussed:  Infection and pain Anesthesia (see MAR for exact dosages):    Anesthesia method:  Local infiltration   Local  anesthetic:  Lidocaine 1% w/o epi Laceration details:    Length (cm):  1 Repair type:    Repair type:  Simple Pre-procedure details:    Preparation:  Patient was prepped and draped in usual sterile fashion Exploration:    Wound exploration: entire depth of wound probed and visualized     Contaminated: no   Treatment:    Area cleansed with:  Betadine   Amount of cleaning:  Standard   Irrigation solution:  Sterile saline Skin repair:    Repair method:  Sutures   Suture size:  5-0   Suture material:  Nylon   Number of sutures:  3 Approximation:    Approximation:  Close Post-procedure details:    Patient tolerance of procedure:  Tolerated well, no immediate complications   (including critical care time)  Medications Ordered in UC Medications - No data to display  Initial Impression / Assessment and Plan / UC Course  I have reviewed the triage vital signs and the nursing notes.  Pertinent labs & imaging results that were available during my care of the patient were reviewed by me and considered in my medical decision making (see chart for details).   Simple sutures to forehead. No complication. Return in 5 days for removal.  Final Clinical Impressions(s) / UC Diagnoses   Final diagnoses:  Laceration of forehead, initial encounter     Discharge Instructions     Nice to meet you. Keep covered and dry for 24 hours. May use Ibuprofen 800mg  every 8 hours if needed for pain and swelling. May take dressing off in 24 hours and let the shower hit your forehead. Come in in 5 days for suture removal. Keep covered until then.    ED Prescriptions    None     PDMP not reviewed this encounter.   Bjorn Pippin, PA-C 11/20/18 1351

## 2018-11-20 NOTE — Discharge Instructions (Addendum)
Nice to meet you. Keep covered and dry for 24 hours. May use Ibuprofen 800mg  every 8 hours if needed for pain and swelling. May take dressing off in 24 hours and let the shower hit your forehead. Come in in 5 days for suture removal. Keep covered until then.

## 2018-11-20 NOTE — ED Triage Notes (Signed)
Pt here for laceration to forehead from weight bar today; bleeding controlled

## 2018-11-25 ENCOUNTER — Other Ambulatory Visit: Payer: Self-pay

## 2018-11-25 ENCOUNTER — Ambulatory Visit (HOSPITAL_COMMUNITY)
Admission: EM | Admit: 2018-11-25 | Discharge: 2018-11-25 | Disposition: A | Discharge status: Home / Self Care | Payer: BC Managed Care – PPO

## 2018-11-25 DIAGNOSIS — Z4802 Encounter for removal of sutures: Secondary | ICD-10-CM

## 2018-11-25 NOTE — ED Triage Notes (Signed)
Pt presents to have 3 sutures removed from forehead. 

## 2021-04-19 DIAGNOSIS — Z23 Encounter for immunization: Secondary | ICD-10-CM | POA: Diagnosis not present

## 2021-04-19 DIAGNOSIS — R739 Hyperglycemia, unspecified: Secondary | ICD-10-CM | POA: Diagnosis not present

## 2021-04-19 DIAGNOSIS — Z13220 Encounter for screening for lipoid disorders: Secondary | ICD-10-CM | POA: Diagnosis not present

## 2021-04-19 DIAGNOSIS — R7401 Elevation of levels of liver transaminase levels: Secondary | ICD-10-CM | POA: Diagnosis not present

## 2021-04-19 DIAGNOSIS — Z0000 Encounter for general adult medical examination without abnormal findings: Secondary | ICD-10-CM | POA: Diagnosis not present

## 2021-04-19 DIAGNOSIS — J45990 Exercise induced bronchospasm: Secondary | ICD-10-CM | POA: Diagnosis not present

## 2021-04-19 DIAGNOSIS — R748 Abnormal levels of other serum enzymes: Secondary | ICD-10-CM | POA: Diagnosis not present

## 2021-09-30 ENCOUNTER — Encounter (HOSPITAL_COMMUNITY): Payer: Self-pay

## 2021-09-30 ENCOUNTER — Emergency Department (HOSPITAL_COMMUNITY)
Admission: EM | Admit: 2021-09-30 | Discharge: 2021-10-01 | Disposition: A | Discharge status: Home / Self Care | Payer: BC Managed Care – PPO | Source: Home / Self Care | Attending: Emergency Medicine | Admitting: Emergency Medicine

## 2021-09-30 ENCOUNTER — Encounter (HOSPITAL_COMMUNITY): Payer: Self-pay | Admitting: Emergency Medicine

## 2021-09-30 ENCOUNTER — Other Ambulatory Visit: Payer: Self-pay

## 2021-09-30 ENCOUNTER — Emergency Department (HOSPITAL_COMMUNITY)
Admission: EM | Admit: 2021-09-30 | Discharge: 2021-09-30 | Discharge status: Against Medical Advice | Payer: BC Managed Care – PPO | Attending: Emergency Medicine | Admitting: Emergency Medicine

## 2021-09-30 ENCOUNTER — Emergency Department (HOSPITAL_COMMUNITY): Payer: BC Managed Care – PPO

## 2021-09-30 ENCOUNTER — Ambulatory Visit (HOSPITAL_COMMUNITY)
Admission: EM | Admit: 2021-09-30 | Discharge: 2021-09-30 | Disposition: A | Discharge status: Home / Self Care | Payer: BC Managed Care – PPO | Attending: Emergency Medicine | Admitting: Emergency Medicine

## 2021-09-30 DIAGNOSIS — J36 Peritonsillar abscess: Secondary | ICD-10-CM

## 2021-09-30 DIAGNOSIS — Z5321 Procedure and treatment not carried out due to patient leaving prior to being seen by health care provider: Secondary | ICD-10-CM | POA: Insufficient documentation

## 2021-09-30 DIAGNOSIS — J020 Streptococcal pharyngitis: Secondary | ICD-10-CM

## 2021-09-30 DIAGNOSIS — J029 Acute pharyngitis, unspecified: Secondary | ICD-10-CM | POA: Insufficient documentation

## 2021-09-30 LAB — CBC WITH DIFFERENTIAL/PLATELET
Abs Immature Granulocytes: 0.08 10*3/uL — ABNORMAL HIGH (ref 0.00–0.07)
Basophils Absolute: 0.1 10*3/uL (ref 0.0–0.1)
Basophils Relative: 1 %
Eosinophils Absolute: 0.2 10*3/uL (ref 0.0–0.5)
Eosinophils Relative: 2 %
HCT: 43.9 % (ref 39.0–52.0)
Hemoglobin: 14.2 g/dL (ref 13.0–17.0)
Immature Granulocytes: 1 %
Lymphocytes Relative: 11 %
Lymphs Abs: 1.4 10*3/uL (ref 0.7–4.0)
MCH: 30 pg (ref 26.0–34.0)
MCHC: 32.3 g/dL (ref 30.0–36.0)
MCV: 92.6 fL (ref 80.0–100.0)
Monocytes Absolute: 1.3 10*3/uL — ABNORMAL HIGH (ref 0.1–1.0)
Monocytes Relative: 10 %
Neutro Abs: 9.2 10*3/uL — ABNORMAL HIGH (ref 1.7–7.7)
Neutrophils Relative %: 75 %
Platelets: 230 10*3/uL (ref 150–400)
RBC: 4.74 MIL/uL (ref 4.22–5.81)
RDW: 11.5 % (ref 11.5–15.5)
WBC: 12.3 10*3/uL — ABNORMAL HIGH (ref 4.0–10.5)
nRBC: 0 % (ref 0.0–0.2)

## 2021-09-30 LAB — COMPREHENSIVE METABOLIC PANEL
ALT: 27 U/L (ref 0–44)
AST: 27 U/L (ref 15–41)
Albumin: 3.9 g/dL (ref 3.5–5.0)
Alkaline Phosphatase: 77 U/L (ref 38–126)
Anion gap: 9 (ref 5–15)
BUN: 7 mg/dL (ref 6–20)
CO2: 24 mmol/L (ref 22–32)
Calcium: 9.5 mg/dL (ref 8.9–10.3)
Chloride: 104 mmol/L (ref 98–111)
Creatinine, Ser: 1.11 mg/dL (ref 0.61–1.24)
GFR, Estimated: >60 mL/min (ref >60)
Glucose, Bld: 110 mg/dL — ABNORMAL HIGH (ref 70–99)
Potassium: 4 mmol/L (ref 3.5–5.1)
Sodium: 137 mmol/L (ref 135–145)
Total Bilirubin: 0.8 mg/dL (ref 0.3–1.2)
Total Protein: 7.3 g/dL (ref 6.5–8.1)

## 2021-09-30 LAB — POCT RAPID STREP A, ED / UC: Streptococcus, Group A Screen (Direct): POSITIVE — AB

## 2021-09-30 MED ORDER — AMOXICILLIN 500 MG PO CAPS: 500.0000 mg | ORAL_CAPSULE | Freq: Two times a day (BID) | ORAL | 0 refills | Status: DC

## 2021-09-30 MED ORDER — ACETAMINOPHEN 325 MG PO TABS
650.0000 mg | ORAL_TABLET | Freq: Once | ORAL | Status: AC
  Administered 2021-09-30: 650 mg via ORAL

## 2021-09-30 MED ORDER — IOHEXOL 300 MG/ML  SOLN
75.0000 mL | Freq: Once | INTRAMUSCULAR | Status: AC | PRN
Start: 2021-09-30 — End: 2021-09-30
  Administered 2021-09-30: 75 mL via INTRAVENOUS

## 2021-09-30 MED ORDER — SODIUM CHLORIDE 0.9 % IV SOLN
3.0000 g | Freq: Once | INTRAVENOUS | Status: AC
  Administered 2021-09-30: 3 g via INTRAVENOUS
  Filled 2021-09-30: qty 8

## 2021-09-30 MED ORDER — DEXAMETHASONE SODIUM PHOSPHATE 10 MG/ML IJ SOLN
10.0000 mg | Freq: Once | INTRAMUSCULAR | Status: AC
  Administered 2021-09-30: 10 mg via INTRAVENOUS
  Filled 2021-09-30: qty 1

## 2021-09-30 MED ORDER — AMOXICILLIN-POT CLAVULANATE 875-125 MG PO TABS: 1.0000 | ORAL_TABLET | Freq: Two times a day (BID) | ORAL | 0 refills | Status: AC

## 2021-09-30 MED ORDER — ACETAMINOPHEN 325 MG PO TABS
ORAL_TABLET | ORAL | Status: AC
  Filled 2021-09-30: qty 2

## 2021-09-30 NOTE — ED Triage Notes (Signed)
Wednesday is when symptoms started.  Reports fever, sore throat, raspy voice.  Patient complains of headache.  No known ill acquaintances.  Patient has had Advil and guaifenesin.

## 2021-09-30 NOTE — ED Triage Notes (Signed)
Patient with strep throat sent from urgent care for evaluation of possible peritonsillar abscess. Patient is alert, oriented, speaking in complete sentences, and is in no apparent distress at this time.

## 2021-09-30 NOTE — ED Triage Notes (Signed)
Patient went to urgent care due to sore throat. He was transferred to [facility], too long of wait so he came here. He said he needs a scan of his throat to make sure there is no abscess. Confirmed strep positive at urgent care. Patient said since Friday his throat feels like it is "harder to get air through."

## 2021-09-30 NOTE — Discharge Instructions (Addendum)
D/C to ED via POV 

## 2021-09-30 NOTE — ED Provider Triage Note (Signed)
Emergency Medicine Provider Triage Evaluation Note  Glenn May , a 31 y.o. male  was evaluated in triage.  Pt was sent here by urgent care for evaluation of possible peritonsillar abscess.  Patient was seen at their clinic with complaints of a sore throat x4 days.  He was tested strep positive.  Based on their clinical exam, they were concerned about the possibility of a peritonsillar abscess and referred him to the emergency department for additional evaluation.  The wait at [facility] was too long, so he came here to [facility].  He did have some fevers initially, however he woke up this morning without a fever.  He states he lost his voice initially, but it is slowly coming back.  Denies difficulty breathing, nausea, vomiting, chest pain.  Review of Systems  Positive:  Negative:   Physical Exam  BP 138/75 (BP Location: Right Arm)   Pulse 76   Temp 99.9 F (37.7 C) (Oral)   Resp 18   Ht 6\' 1"  (1.854 m)   Wt 88.5 kg   SpO2 98%   BMI 25.73 kg/m  Gen:   Awake, no distress   Resp:  Normal effort  MSK:   Moves extremities without difficulty  Other:  Bilateral tonsillar swelling with exudates and erythema.  Uvula is midline.  Airway intact.  Slightly muffled hot potato voice.  No trismus.  Medical Decision Making  Medically screening exam initiated at 9:02 PM.  Appropriate orders placed.  Glenn May was informed that the remainder of the evaluation will be completed by another provider, this initial triage assessment does not replace that evaluation, and the importance of remaining in the ED until their evaluation is complete.  Labs done at urgent care   Glenn May, Glenn May 09/30/21 2104

## 2021-09-30 NOTE — Discharge Instructions (Addendum)
You have been seen today for your complaint of strep throat and tonsillitis. Your imaging showed findings consistent with your diagnosis of strep throat.  It also showed a very small tonsillar or peritonsillar abscess.  This is not large enough to drain at this time. Your discharge medications include Augmentin you should take this as prescribed. Home care instructions are as follows:  You should continue to eat and drink as tolerated.  You may take Tylenol or ibuprofen for fevers or pain. Follow up with: ENT.  Call for an appointment.  Your primary care within 1 week Please seek immediate medical care if you develop any of the following symptoms: You have more pain, swelling, redness, or pus in your throat. You have a headache. You have low energy or feel generally sick. You have a fever or chills. You have trouble swallowing or eating. You have signs of not enough water in the body (dehydration), such as: Feeling light-headed or dizzy when you are standing. Peeing (urinating) less than usual. A fast heart rate. Dry mouth.  At this time there does not appear to be the presence of an emergent medical condition, however there is always the potential for conditions to change. Please read and follow the below instructions.  Do not take your medicine if  develop an itchy rash, swelling in your mouth or lips, or difficulty breathing; call 911 and seek immediate emergency medical attention if this occurs.  You may review your lab tests and imaging results in their entirety on your MyChart account.  Please discuss all results of fully with your primary care provider and other specialist at your follow-up visit.  Note: Portions of this text may have been transcribed using voice recognition software. Every effort was made to ensure accuracy; however, inadvertent computerized transcription errors may still be present.

## 2021-09-30 NOTE — ED Notes (Signed)
Pt stated he was unhappy with the wait, walked out [facility]

## 2021-09-30 NOTE — ED Notes (Signed)
The pt is at [facility] long ed to be seen

## 2021-09-30 NOTE — ED Notes (Addendum)
Patient is being discharged from the Urgent Care and sent to the Emergency Department via pov . Per Rising, PA, patient is in need of higher level of care due to peritonsillar abscess. Patient is aware and verbalizes understanding of plan of care.  Vitals:   09/30/21 1744  BP: 118/71  Pulse: 89  Resp: 20  Temp: (!) 101.1 F (38.4 C)  SpO2: 97%

## 2021-09-30 NOTE — ED Provider Notes (Signed)
[facility] Provider Note   CSN: [id number] Arrival date & time: 09/30/21  2042     History  Chief Complaint  Patient presents with   Sore Throat    Glenn May is a 31 y.o. male.  With no significant medical history presents to the ED via urgent care for evaluation of possible peritonsillar abscess.  He was seen in urgent care earlier today for evaluation of sore throat.  Sore throat started on Wednesday.  He reported started as a dry throat. states he has had to focus more on swallowing but has had no difficulty swallowing.  Has been able to tolerate food and liquids p.o.  Denies fevers, chills, drooling, hoarse voice, shortness of breath.   Sore Throat       Home Medications Prior to Admission medications   Medication Sig Start Date End Date Taking? Authorizing Provider  Fexofenadine HCl (MUCINEX ALLERGY PO) Take 1 tablet by mouth daily.   Yes [provider]  Ibuprofen (ADVIL PO) Take 1 tablet by mouth every 6 (six) hours as needed (pain).   Yes [provider]  albuterol (PROVENTIL HFA;VENTOLIN HFA) 108 (90 BASE) MCG/ACT inhaler Inhale 2 puffs into the lungs every 6 (six) hours as needed for wheezing or shortness of breath. Patient not taking: Reported on 09/30/2021 11/22/14   Hamilton Capri P, DO      Allergies    Patient has no known allergies.    Review of Systems   Review of Systems  HENT:  Positive for congestion, postnasal drip, sinus pressure, sinus pain and sore throat. Negative for drooling, facial swelling, trouble swallowing and voice change.   Eyes:  Negative for pain.  All other systems reviewed and are negative.   Physical Exam Updated Vital Signs BP 127/74   Pulse 67   Temp 99.9 F (37.7 C) (Oral)   Resp 18   Ht 6\' 1"  (1.854 m)   Wt 88.5 kg   SpO2 99%   BMI 25.73 kg/m  Physical Exam Vitals and nursing note reviewed.  Constitutional:      General: He is not in acute distress.     Appearance: He is well-developed.  HENT:     Head: Normocephalic and atraumatic.     Mouth/Throat:     Mouth: Mucous membranes are moist.     Pharynx: Uvula midline. Pharyngeal swelling, oropharyngeal exudate and posterior oropharyngeal erythema present.     Tonsils: Tonsillar exudate and tonsillar abscess present. 3+ on the right. 3+ on the left.   Eyes:     Conjunctiva/sclera: Conjunctivae normal.  Cardiovascular:     Rate and Rhythm: Normal rate and regular rhythm.     Heart sounds: No murmur heard. Pulmonary:     Effort: Pulmonary effort is normal. No respiratory distress.     Breath sounds: Normal breath sounds.  Abdominal:     Palpations: Abdomen is soft.     Tenderness: There is no abdominal tenderness.  Musculoskeletal:        General: No swelling.     Cervical back: Neck supple.  Skin:    General: Skin is warm and dry.     Capillary Refill: Capillary refill takes less than 2 seconds.  Neurological:     Mental Status: He is alert.  Psychiatric:        Mood and Affect: Mood normal.     ED Results / Procedures / Treatments   Labs (all labs ordered are listed, but only abnormal results  are displayed) Labs Reviewed - No data to display  EKG None  Radiology CT Soft Tissue Neck W Contrast  Result Date: 09/30/2021 CLINICAL DATA:  Initial evaluation for acute fever, sore throat. EXAM: CT NECK WITH CONTRAST TECHNIQUE: Multidetector CT imaging of the neck was performed using the standard protocol following the bolus administration of intravenous contrast. RADIATION DOSE REDUCTION: This exam was performed according to the departmental dose-optimization program which includes automated exposure control, adjustment of the mA and/or kV according to patient size and/or use of iterative reconstruction technique. CONTRAST:  35mL OMNIPAQUE IOHEXOL 300 MG/ML  SOLN COMPARISON:  None Available. FINDINGS: Pharynx and larynx: Oral cavity within normal limits. Enlargement of the palatine  tonsils bilaterally, right greater than left, suggesting acute tonsillitis. Small 1 cm hypodensity at the anterolateral margin of the right tonsil suspicious for early tonsillar/peritonsillar abscess (series 2, image 41). Mucosal edema within the adjacent right pharynx, consistent with associated pharyngitis. Mild hazy inflammatory stranding within the parapharyngeal fat. Nasopharynx within normal limits. No retropharyngeal collection. Negative epiglottis. Remainder of the hypopharynx and supraglottic larynx within normal limits. Negative glottis. Subglottic airway patent clear. Salivary glands: Parotid and submandibular glands within normal limits. Thyroid: Normal. Lymph nodes: Prominent upper cervical adenopathy, measuring up to 1.2 cm at level 2, presumably reactive. Vascular: Normal intravascular enhancement seen throughout the neck. Limited intracranial: Unremarkable. Visualized orbits: Unremarkable. Mastoids and visualized paranasal sinuses: Moderate mucosal thickening present throughout the visualized paranasal sinuses. Visualized mastoids and middle ear cavities are well pneumatized and free of fluid. Skeleton: No discrete or worrisome osseous lesions. Mild moderate spondylosis present at C5-6 and C6-7. Upper chest: Visualized upper chest demonstrates no acute finding. Other: None. IMPRESSION: 1. Findings consistent with acute pharyngitis. Superimposed 1 cm hypodensity at the anterolateral margin of the right palatine tonsil, suspicious for early tonsillar/peritonsillar abscess. 2. Enlarged upper cervical adenopathy, presumably reactive. 3. Moderate inflammatory paranasal sinus disease. Electronically Signed   By: Rise Mu M.D.   On: 09/30/2021 22:48    Procedures Procedures    Medications Ordered in ED Medications  iohexol (OMNIPAQUE) 300 MG/ML solution 75 mL (75 mLs Intravenous Contrast Given 09/30/21 2126)    ED Course/ Medical Decision Making/ A&P Clinical Course as of 09/30/21  2259  Sun Sep 30, 2021  2252 CT Soft Tissue Neck W Contrast Personally reviewed and interpreted the image.  Findings consistent with pharyngitis superimposed 1 cm hypodensity in the right palatine tonsil suspicious for early peritonsillar abscess. [AS]    Clinical Course User Index [AS] Lula Olszewski Edsel Petrin, PA-C                           Medical Decision Making This patient presents to the ED for concern of strep throat and possible peritonsillar abscess, this involves an extensive number of treatment options, and is a complaint that carries with it a high risk of complications and morbidity.  The differential diagnosis includes strep throat, peritonsillar abscess, retropharyngeal abscess   Co morbidities that complicate the patient evaluation       None  My initial workup includes  Additional history obtained from: Nursing notes from this visit. Prior UC visit on Today, rapid strep test positive  I ordered imaging studies including CT neck I independently visualized and interpreted imaging which showed findings consistent with pharyngitis and a 1 cm hypodensity suspicious for early tonsillar or peritonsillar abscess I agree with the radiologist interpretation   Afebrile and hemodynamically stable, patient has not  had issues handling oral secretions, patient is still able to swallow foods and liquids.  I have treated the patient with IV Decadron in the ED.  I will send a prescription for Augmentin for strep throat and peritonsillar abscess.  I will give a dose of IV Unasyn prior to discharge due to pharmacies being closed.  I have discussed in depth the return precautions including difficulty swallowing, shortness of breath, feeling like your throat is closing, drooling, hoarse voice.  I have encouraged patient to call ENT for ED follow-up visit regarding his peritonsillar abscess.   At this time there does not appear to be any evidence of an acute emergency medical condition and the  patient appears stable for discharge with appropriate outpatient follow up. Diagnosis was discussed with patient who verbalizes understanding of care plan and is agreeable to discharge. I have discussed return precautions with patient who verbalizes understanding. Patient encouraged to follow-up with their PCP within 1 week. All questions answered.  Patient's case discussed with Dr. Rhunette Croft who agrees with plan to discharge with follow-up.   Note: Portions of this report may have been transcribed using voice recognition software. Every effort was made to ensure accuracy; however, inadvertent computerized transcription errors may still be present.   Amount and/or Complexity of Data Reviewed Radiology:  Decision-making details documented in ED Course.          Final Clinical Impression(s) / ED Diagnoses Final diagnoses:  Strep throat  Peritonsillar abscess    Rx / DC Orders ED Discharge Orders     None         Michelle Piper, Cordelia Poche 09/30/21 2338    Derwood Kaplan, MD 10/03/21 [phone]

## 2021-09-30 NOTE — ED Provider Notes (Signed)
[facility]    CSN: [id number] Arrival date & time: 09/30/21  1613     History   Chief Complaint Chief Complaint  Patient presents with   Sore Throat    HPI Glenn May is a 31 y.o. male.  Presents with 4-day history of sore throat. 4/10 pain today. Some bilateral ear pain additionally.  Reports subjective fever at home, raspy voice, headache.  He has tried Advil and Mucinex.  No medicine taken today  Denies congestion, cough, chest pain, shortness of breath, abdominal pain, vomiting/diarrhea, rash.  No known sick contacts  Past Medical History:  Diagnosis Date   Allergy    Asthma     Patient Active Problem List   Diagnosis Date Noted   Exercise-induced asthma 04/09/2011    History reviewed. No pertinent surgical history.   Home Medications    Prior to Admission medications   Medication Sig Start Date End Date Taking? Authorizing Provider  loratadine (CLARITIN) 10 MG tablet Take 10 mg by mouth daily.   Yes [provider]  Multiple Vitamin (MULTIVITAMIN) tablet Take 1 tablet by mouth daily.   Yes [provider]  albuterol (PROVENTIL HFA;VENTOLIN HFA) 108 (90 BASE) MCG/ACT inhaler Inhale 2 puffs into the lungs every 6 (six) hours as needed for wheezing or shortness of breath. 11/22/14   Lenell Antu, DO    Family History History reviewed. No pertinent family history.  Social History Social History   Tobacco Use   Smoking status: Never   Smokeless tobacco: Never  Vaping Use   Vaping Use: Never used  Substance Use Topics   Alcohol use: Yes   Drug use: No     Allergies   Patient has no known allergies.   Review of Systems Review of Systems Per HPI  Physical Exam Triage Vital Signs ED Triage Vitals [09/30/21 1741]  Enc Vitals Group     BP      Pulse      Resp      Temp      Temp src      SpO2      Weight      Height      Head Circumference      Peak Flow      Pain Score 4     Pain Loc      Pain Edu?       Excl. in GC?    No data found.  Updated Vital Signs BP 118/71 (BP Location: Right Arm) Comment (BP Location): large cuff  Pulse 89   Temp (!) 101.1 F (38.4 C) (Oral)   Resp 20   SpO2 97%    Physical Exam Vitals and nursing note reviewed.  HENT:     Right Ear: Tympanic membrane and ear canal normal.     Left Ear: Tympanic membrane and ear canal normal.     Mouth/Throat:     Mouth: Mucous membranes are moist.     Pharynx: Pharyngeal swelling, posterior oropharyngeal erythema and uvula swelling present.     Tonsils: Tonsillar abscess present. No tonsillar exudate. 4+ on the right. 4+ on the left.  Eyes:     Conjunctiva/sclera: Conjunctivae normal.  Cardiovascular:     Rate and Rhythm: Normal rate and regular rhythm.     Pulses: Normal pulses.     Heart sounds: Normal heart sounds.  Pulmonary:     Effort: Pulmonary effort is normal.     Breath sounds: Normal breath sounds.  Lymphadenopathy:  Cervical: No cervical adenopathy.  Neurological:     Mental Status: He is oriented to person, place, and time.     UC Treatments / Results  Labs (all labs ordered are listed, but only abnormal results are displayed) Labs Reviewed  POCT RAPID STREP A, ED / UC - Abnormal; Notable for the following components:      Result Value   Streptococcus, Group A Screen (Direct) POSITIVE (*)    All other components within normal limits    EKG  Radiology No results found.  Procedures Procedures   Medications Ordered in UC Medications  acetaminophen (TYLENOL) tablet 650 mg (650 mg Oral Given 09/30/21 1750)    Initial Impression / Assessment and Plan / UC Course  I have reviewed the triage vital signs and the nursing notes.  Pertinent labs & imaging results that were available during my care of the patient were reviewed by me and considered in my medical decision making (see chart for details).  Febrile at 101.1 in clinic.  Dose of Tylenol given. Strep test positive. Concerns  for peritonsillar abscess.  Patient has area of swelling and erythema on the tonsillar arch extending to the soft palate.  Recommend patient be seen in the emergency department for CAT scan and possible drainage.  Patient understands we do not have resources available at this urgent care for evaluation and treatment for this.  He is discharged to the emergency department via personal vehicle.  Final Clinical Impressions(s) / UC Diagnoses   Final diagnoses:  Strep throat  Peritonsillar abscess     Discharge Instructions      D/C to ED via POV     ED Prescriptions     Medication Sig Dispense Auth. Provider            PDMP not reviewed this encounter.   Siddhartha Hoback, Ray Church 09/30/21 1815

## 2022-10-08 DIAGNOSIS — W57XXXA Bitten or stung by nonvenomous insect and other nonvenomous arthropods, initial encounter: Secondary | ICD-10-CM | POA: Diagnosis not present

## 2022-10-08 DIAGNOSIS — R509 Fever, unspecified: Secondary | ICD-10-CM | POA: Diagnosis not present

## 2022-10-08 DIAGNOSIS — S40862A Insect bite (nonvenomous) of left upper arm, initial encounter: Secondary | ICD-10-CM | POA: Diagnosis not present
